# Patient Record
Sex: Female | Born: 1950 | Race: White | Hispanic: No | Marital: Married | Smoking: Former smoker
Health system: Southern US, Community
[De-identification: ages and names within clinical notes are randomized; demographics above are authoritative.]

## PROBLEM LIST (undated history)

## (undated) DIAGNOSIS — F32A Depression, unspecified: Secondary | ICD-10-CM

## (undated) DIAGNOSIS — G4730 Sleep apnea, unspecified: Secondary | ICD-10-CM

## (undated) DIAGNOSIS — F329 Major depressive disorder, single episode, unspecified: Secondary | ICD-10-CM

## (undated) HISTORY — DX: Depression, unspecified: F32.A

## (undated) HISTORY — PX: ABDOMINAL HYSTERECTOMY: SHX81

## (undated) HISTORY — DX: Sleep apnea, unspecified: G47.30

## (undated) HISTORY — DX: Major depressive disorder, single episode, unspecified: F32.9

---

## 1998-07-18 ENCOUNTER — Other Ambulatory Visit: Admission: RE | Admit: 1998-07-18 | Discharge: 1998-07-18 | Payer: Self-pay | Admitting: Obstetrics and Gynecology

## 1999-11-16 ENCOUNTER — Other Ambulatory Visit: Admission: RE | Admit: 1999-11-16 | Discharge: 1999-11-16 | Payer: Self-pay | Admitting: Obstetrics and Gynecology

## 2004-06-09 ENCOUNTER — Encounter: Admission: RE | Admit: 2004-06-09 | Discharge: 2004-09-07 | Payer: Self-pay | Admitting: Family Medicine

## 2004-10-29 ENCOUNTER — Encounter: Admission: RE | Admit: 2004-10-29 | Discharge: 2005-01-27 | Payer: Self-pay | Admitting: Family Medicine

## 2004-11-16 ENCOUNTER — Ambulatory Visit (HOSPITAL_BASED_OUTPATIENT_CLINIC_OR_DEPARTMENT_OTHER): Admission: RE | Admit: 2004-11-16 | Discharge: 2004-11-16 | Payer: Self-pay | Admitting: Family Medicine

## 2004-11-16 ENCOUNTER — Ambulatory Visit: Payer: Self-pay | Admitting: Internal Medicine

## 2005-05-24 ENCOUNTER — Other Ambulatory Visit: Admission: RE | Admit: 2005-05-24 | Discharge: 2005-05-24 | Payer: Self-pay | Admitting: Family Medicine

## 2006-08-04 ENCOUNTER — Other Ambulatory Visit: Admission: RE | Admit: 2006-08-04 | Discharge: 2006-08-04 | Payer: Self-pay | Admitting: Family Medicine

## 2007-07-07 ENCOUNTER — Encounter: Admission: RE | Admit: 2007-07-07 | Discharge: 2007-07-07 | Payer: Self-pay | Admitting: Family Medicine

## 2008-10-22 IMAGING — US US ABDOMEN COMPLETE
1 series · 14 of 25 positions shown · non-contrast
Comparison: none

CLINICAL DATA: Elevated LFTs. 
 COMPLETE ABDOMINAL ULTRASOUND:
TECHNIQUE: Complete abdominal ultrasound examination was performed including evaluation of the liver, gallbladder, bile ducts, pancreas, kidneys, spleen, IVC, and abdominal aorta.

[Series 1: us abdomen complete · 0.28mm/px · 14 of 63 slices shown]
[im 1/63]
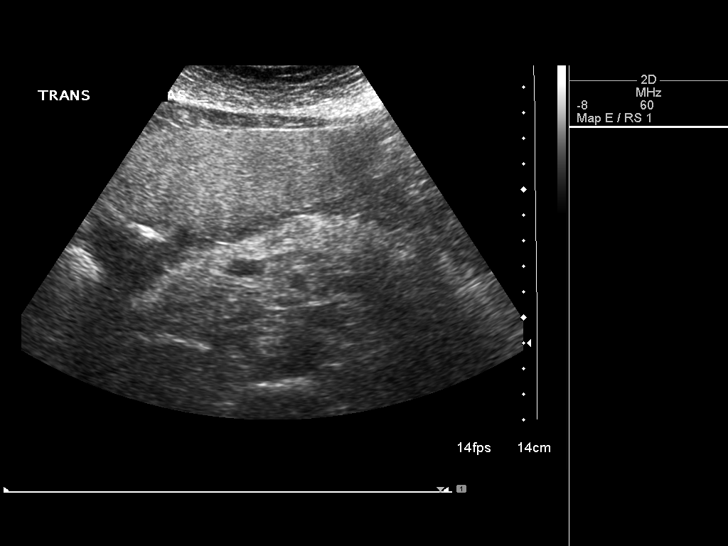
[im 6/63]
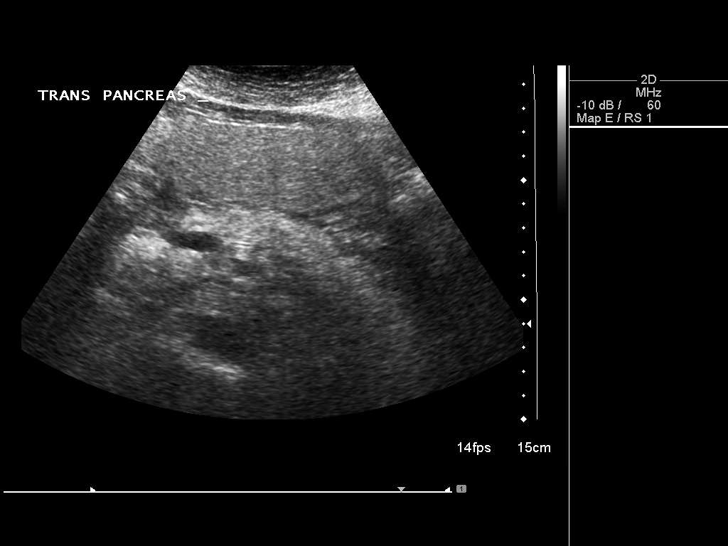
[im 11/63]
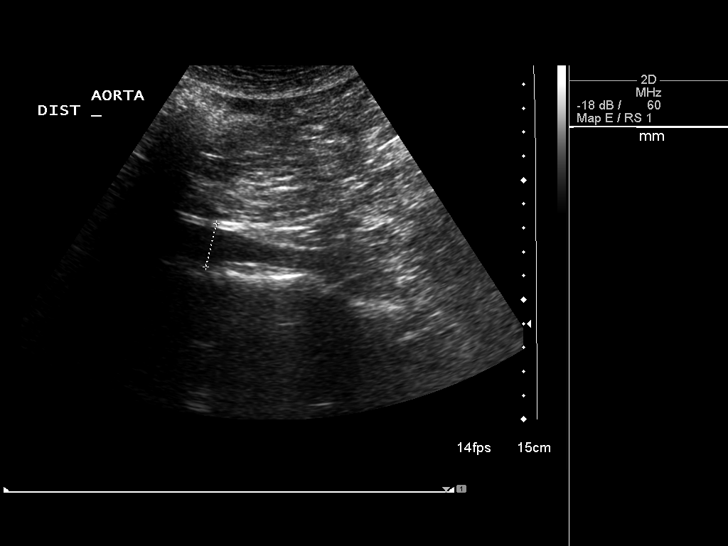
[im 16/63]
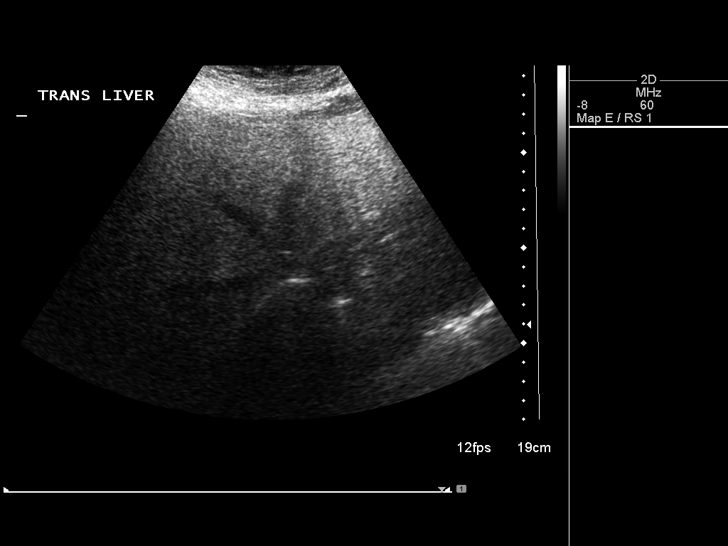
[im 21/63]
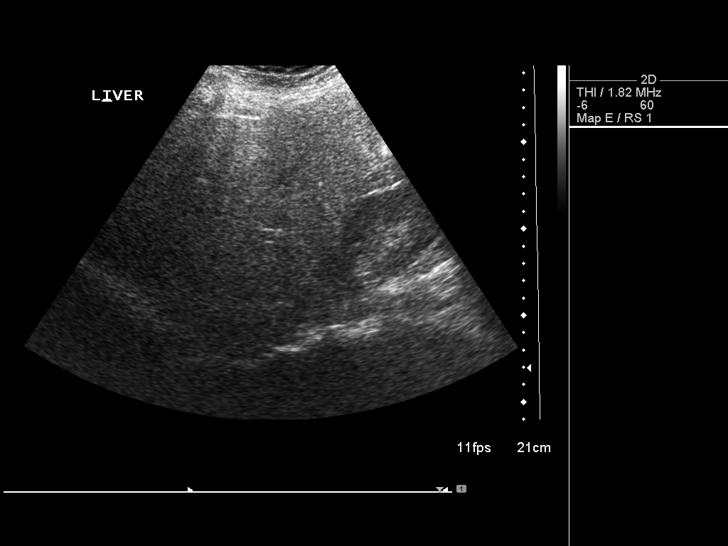
[im 24/63]
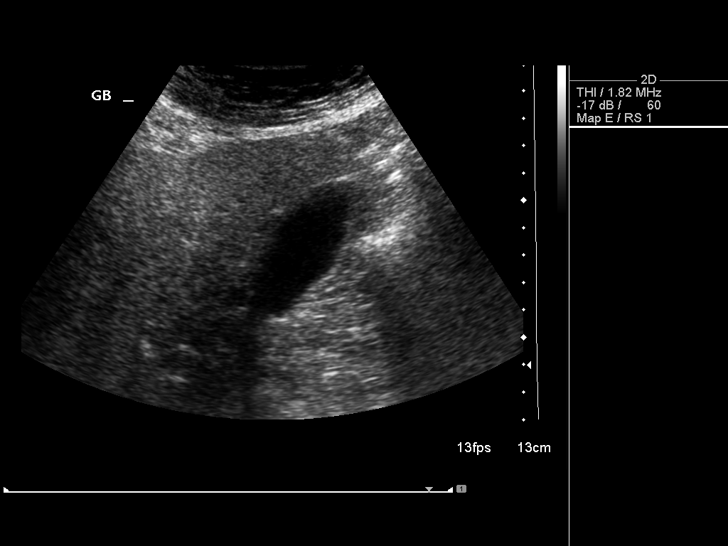
[im 29/63]
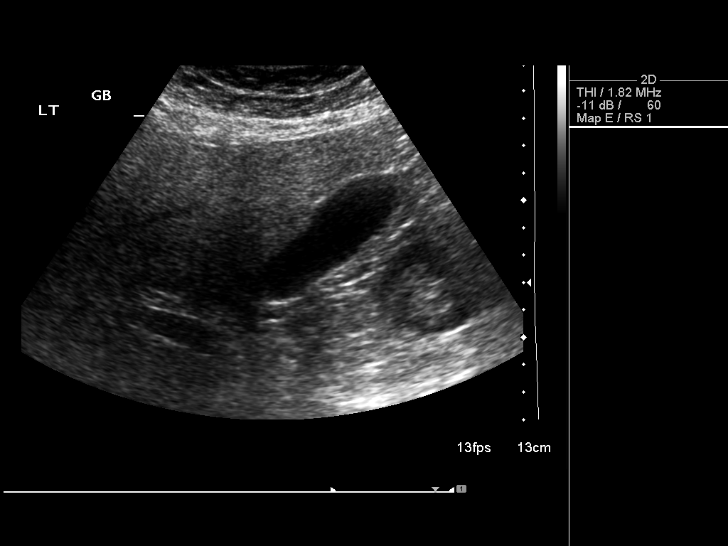
[im 34/63]
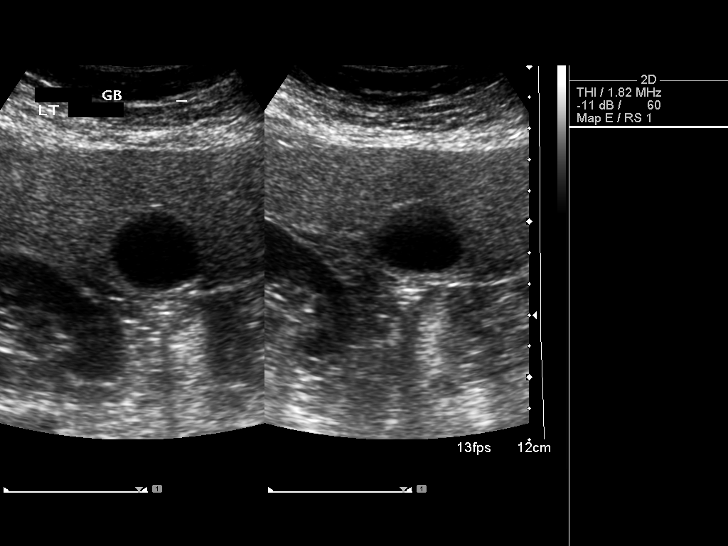
[im 39/63]
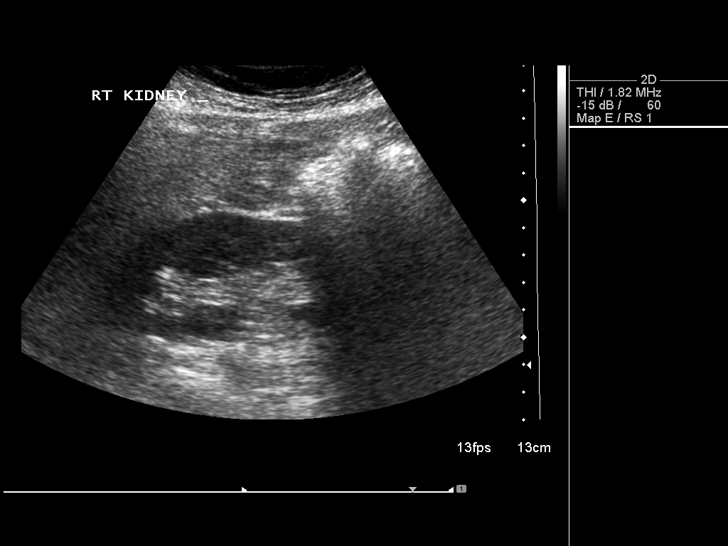
[im 42/63]
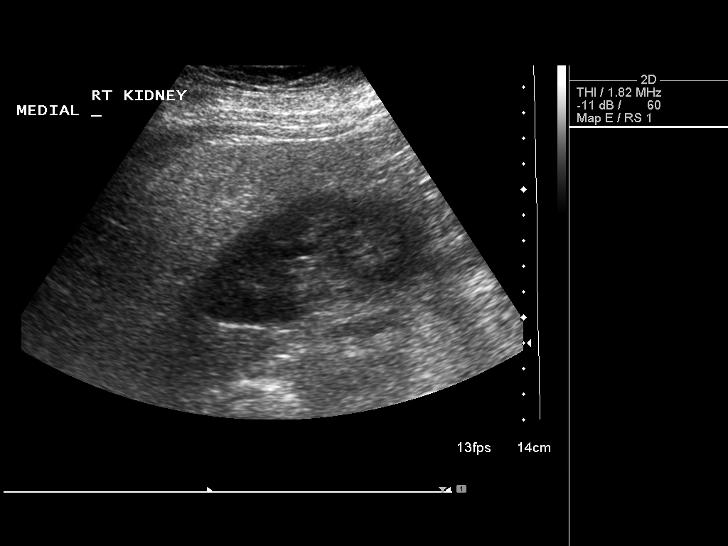
[im 47/63]
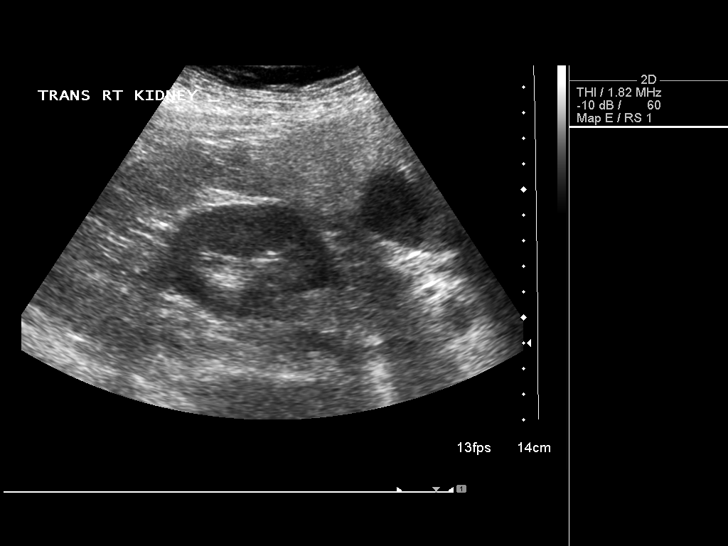
[im 52/63]
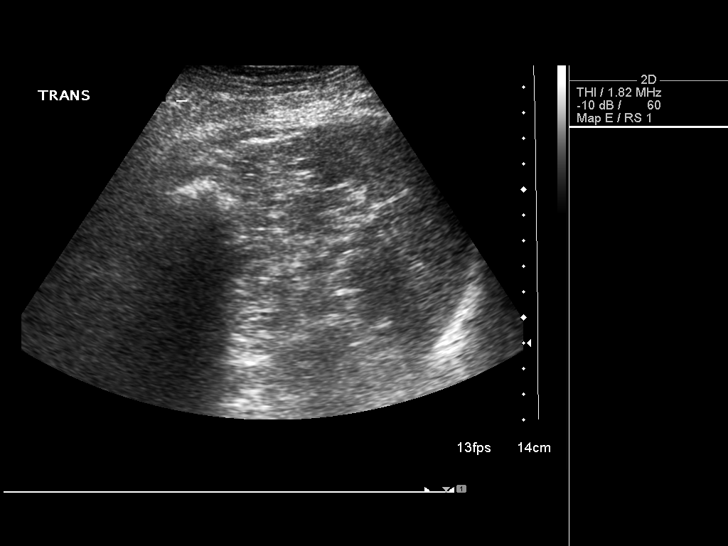
[im 57/63]
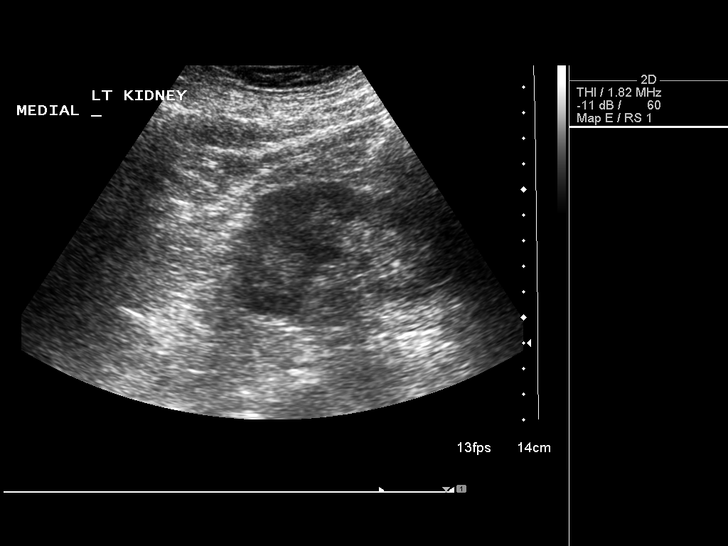
[im 63/63]
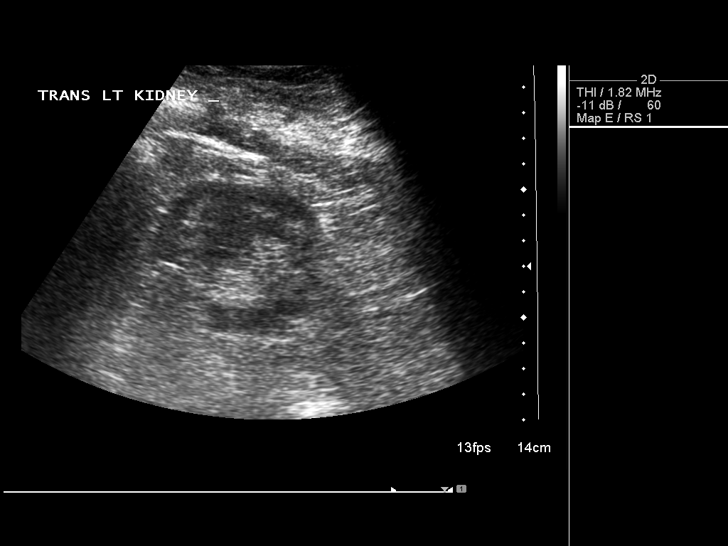

[14 of 25 positions shown; findings below may reference images not displayed]

FINDINGS: The gallbladder is well seen and no gallstones are noted.  The liver is diffusely echogenic consistent with fatty infiltration.  The common bile duct is normal measuring 4.2mm.  The IVC, pancreas, and spleen appear normal.  No hydronephrosis is seen.  The right kidney measures 10.9cm sagittally with the left kidney measuring 11.4cm.  The abdominal aorta is normal in caliber.
IMPRESSION: 1.  No gallstones. 
 2.  Diffuse fatty infiltration of the liver.

## 2011-02-19 NOTE — Procedures (Signed)
Kathryn Clayton, Kathryn Clayton                  ACCOUNT NO.:  [id number]   MEDICAL RECORD NO.:  [id number]          PATIENT TYPE:  OUT   LOCATION:  SLEEP CENTER                 FACILITY:  [facility]   PHYSICIAN:  Clinton D. Maple Hudson, M.D. DATE OF BIRTH:  [birth year]   DATE OF STUDY:  11/16/2004                              NOCTURNAL POLYSOMNOGRAM   REFERRING PHYSICIAN:  Maurice Small, MD   INDICATIONS FOR STUDY:  Hypersomnia with sleep apnea. Epworth sleepiness  score 11/24, BMI 33.9, weight 199 pounds.   SLEEP ARCHITECTURE:  Total sleep time 385 minutes with sleep efficiency of  77%. Stage I was 6%, stage II was 70%, stages III and IV were 1%, REM was  22% of total sleep time. Latency to sleep onset 51 minutes. Latency to REM  296 minutes. Awake after sleep onset 64 minutes. Arousal index 10.  She did  not take her usual Ambien for this study.   RESPIRATORY DATA:  Split study protocol. Respiratory disturbance index (RDI)  68.3 obstructive events per hour indicating severe obstructive sleep  apnea/hypopnea syndrome before CPAP. This included 51 obstructive apneas, 2  central apneas, and 115 hypopneas before CPAP. The events were not  positional. REM RDI of 4.9 per hour. CPAP was titrated to 11 CWP, RDI 4.1  per hour using a small full face Ultra Mirage mask with heated humidifier.   OXYGEN DATA:  Moderate snoring with oxygen desaturation to a nadir of 81%  before CPAP. After CPAP control, oxygen saturation held 95% to 98% on room  air.   CARDIAC DATA:  Normal sinus rhythm.   MOVEMENT/PARASOMNIA:  A total of limb jerks were recorded of which 12 were  associated with arousal or awakenings for a periodic limb movement with  arousal index of 1.9 per hour, which is probably insignificant.   IMPRESSION/RECOMMENDATIONS:  1.  Severe obstructive sleep apnea/hypopnea syndrome, RDI of 68.3 per hour      with oxygen desaturation to 81%.  2.  Successful CPAP titration to 11 CWP, RDI of 4.1 per hour using a  small      Ultra Mirage full face mask with heated humidifier.     CDY/MEDQ  D:  11/22/2004 13:45:58  T:  11/23/2004 12:00:19  Job:  [id number]

## 2013-08-22 ENCOUNTER — Other Ambulatory Visit: Payer: Self-pay | Admitting: *Deleted

## 2013-08-22 ENCOUNTER — Ambulatory Visit (INDEPENDENT_AMBULATORY_CARE_PROVIDER_SITE_OTHER): Payer: BC Managed Care – PPO | Admitting: Endocrinology

## 2013-08-22 ENCOUNTER — Encounter: Payer: Self-pay | Admitting: Endocrinology

## 2013-08-22 VITALS — BP 124/78 | HR 68 | Temp 98.3°F | Resp 12 | Ht 64.4 in | Wt 199.6 lb

## 2013-08-22 DIAGNOSIS — F32A Depression, unspecified: Secondary | ICD-10-CM | POA: Insufficient documentation

## 2013-08-22 DIAGNOSIS — E119 Type 2 diabetes mellitus without complications: Secondary | ICD-10-CM

## 2013-08-22 DIAGNOSIS — F329 Major depressive disorder, single episode, unspecified: Secondary | ICD-10-CM | POA: Insufficient documentation

## 2013-08-22 DIAGNOSIS — E785 Hyperlipidemia, unspecified: Secondary | ICD-10-CM | POA: Insufficient documentation

## 2013-08-22 LAB — COMPREHENSIVE METABOLIC PANEL
Albumin: 4.1 g/dL (ref 3.5–5.2)
BUN: 16 mg/dL (ref 6–23)
Creatinine, Ser: 0.9 mg/dL (ref 0.4–1.2)
GFR: 68.63 mL/min (ref >60.00)
Potassium: 4 mEq/L (ref 3.5–5.1)
Total Bilirubin: 1 mg/dL (ref 0.3–1.2)
Total Protein: 6.9 g/dL (ref 6.0–8.3)

## 2013-08-22 LAB — LIPID PANEL
Cholesterol: 333 mg/dL — ABNORMAL HIGH (ref 0–200)
HDL: 83.8 mg/dL (ref >39.00)
Total CHOL/HDL Ratio: 4
VLDL: 33 mg/dL (ref 0.0–40.0)

## 2013-08-22 LAB — LDL CHOLESTEROL, DIRECT: Direct LDL: 239.9 mg/dL

## 2013-08-22 MED ORDER — ROSUVASTATIN CALCIUM 20 MG PO TABS: 20.0000 mg | ORAL_TABLET | Freq: Every day | ORAL | Status: DC

## 2013-08-22 NOTE — Progress Notes (Signed)
Patient ID: Kathryn Clayton, female   DOB: Sep 06, 1951, 62 y.o.   MRN: [id number]  Reason for Appointment: Diabetes follow-up   History of Present Illness   Diagnosis: Type 2 DIABETES MELITUS, date of diagnosis: 2005     Previous history: Initially on metformin. She has been on Actos since 2008 and subsequently Januvia was added Her blood sugars have been generally well controlled with A1c 6.1-6.5 Compliance with diet and exercise has been variable and she has had difficulty losing weight until recently  Recent history: She is back for her 6 month visit for her diabetes. She is still having excellent blood sugars at home. Also has been able to lose a little weight with keeping her portions down. However has not been exercising, previously had been walking. Has been compliant with her 3 drug regimen although would prefer to have her Janumet once a day     Oral hypoglycemic drugs: Actos 30 mg, Janumet 50/100 twice a day        Side effects from medications: None Insulin regimen:         Proper timing of medications in relation to meals: Yes.          Monitors blood glucose: Once a day.    Glucometer: One Touch.          Blood Glucose readings from meter download: readings before breakfast:  104-123, at bedtime 100 Hypoglycemia frequency:  none         Meals: 3 meals per day.try to keep portions small Exercise: Only occasional walking now            Dietician visit: Most recent: 2009     Weight control: her previous weight was 202  Wt Readings from Last 3 Encounters:  08/22/13 199 lb 9.6 oz (90.538 kg)         Complications:None    Diabetes labs:  Lab Results  Component Value Date   HGBA1C 6.1 08/22/2013   No results found for this basename: GLUF,  MICROALBUR,  LDLCALC,  CREATININE   PROBLEM #2: Hyperlipidemia  She has had marked hypercholesterolemia. Because of cost she changed from the Vytorin to Lipitor on her last visit. She tolerated this well but after about 4 months she  started having a lot of muscle aches and not feeling well as well as some memory disturbance. She went off the Lipitor 3 months ago and did not call for a replacement  PROBLEM #3:  She is asking about occasional tingling in her hands when she is reading the newspaper or driving but not at night. No numbness or tingling in her feet  PROBLEM #4:  She has seen a dermatologist for a rash on her lower legs. She tends to get shallow ulcers easily and may get a little reddish. Has stopped shaving her legs because of this. No treatment was suggested     Medication List       This list is accurate as of: 08/22/13 11:59 PM.  Always use your most recent med list.               aspirin 81 MG tablet  Take 81 mg by mouth daily.     buPROPion 300 MG 24 hr tablet  Commonly known as:  WELLBUTRIN XL  Take 300 mg by mouth daily.     buPROPion 150 MG 24 hr tablet  Commonly known as:  WELLBUTRIN XL  Take 150 mg by mouth daily.     JANUMET 50-1000 MG  per tablet  Generic drug:  sitaGLIPtin-metformin  Take 1 tablet by mouth 2 (two) times daily with a meal.     pioglitazone 30 MG tablet  Commonly known as:  ACTOS  Take 30 mg by mouth daily.     rosuvastatin 20 MG tablet  Commonly known as:  CRESTOR  Take 1 tablet (20 mg total) by mouth daily.     sertraline 100 MG tablet  Commonly known as:  ZOLOFT  Take 100 mg by mouth daily.        Allergies:  Allergies  Allergen Reactions  . Sulfa Antibiotics     Past Medical History  Diagnosis Date  . Depression   . Sleep apnea     Past Surgical History  Procedure Laterality Date  . Abdominal hysterectomy      Family History  Problem Relation Age of Onset  . Heart disease Mother     Social History:  reports that she quit smoking about 40 years ago. She has never used smokeless tobacco. Her alcohol and drug histories are not on file.  Review of Systems:   no history of hypertension   Known history of depression      LABS:  Office Visit on 08/22/2013  Component Date Value Range Status  . Hemoglobin A1C 08/22/2013 6.1  4.6 - 6.5 % Final   Glycemic Control Guidelines for People with Diabetes:Non Diabetic:  <6%Goal of Therapy: <7%Additional Action Suggested:  >8%   . Sodium 08/22/2013 135  135 - 145 mEq/L Final  . Potassium 08/22/2013 4.0  3.5 - 5.1 mEq/L Final  . Chloride 08/22/2013 100  96 - 112 mEq/L Final  . CO2 08/22/2013 31  19 - 32 mEq/L Final  . Glucose, Bld 08/22/2013 109* 70 - 99 mg/dL Final  . BUN 16/07/9603 16  6 - 23 mg/dL Final  . Creatinine, Ser 08/22/2013 0.9  0.4 - 1.2 mg/dL Final  . Total Bilirubin 08/22/2013 1.0  0.3 - 1.2 mg/dL Final  . Alkaline Phosphatase 08/22/2013 54  39 - 117 U/L Final  . AST 08/22/2013 24  0 - 37 U/L Final  . ALT 08/22/2013 25  0 - 35 U/L Final  . Total Protein 08/22/2013 6.9  6.0 - 8.3 g/dL Final  . Albumin 54/06/8118 4.1  3.5 - 5.2 g/dL Final  . Calcium 14/78/2956 9.5  8.4 - 10.5 mg/dL Final  . GFR 21/30/8657 68.63  >60.00 mL/min Final  . Cholesterol 08/22/2013 333* 0 - 200 mg/dL Final   ATP III Classification       Desirable:  < 200 mg/dL               Borderline High:  200 - 239 mg/dL          High:  > = 846 mg/dL  . Triglycerides 08/22/2013 165.0* 0.0 - 149.0 mg/dL Final   Normal:  <962 mg/dLBorderline High:  150 - 199 mg/dL  . HDL 08/22/2013 83.80  >39.00 mg/dL Final  . VLDL 95/28/4132 33.0  0.0 - 40.0 mg/dL Final  . Total CHOL/HDL Ratio 08/22/2013 4   Final                  Men          Women1/2 Average Risk     3.4          3.3Average Risk          5.0          4.42X Average Risk  9.6          7.13X Average Risk          15.0          11.0                      . Direct LDL 08/22/2013 239.9   Final   Optimal:  <100 mg/dLNear or Above Optimal:  100-129 mg/dLBorderline High:  130-159 mg/dLHigh:  160-189 mg/dLVery High:  >190 mg/dL     Examination:   BP 409/81  Pulse 68  Temp(Src) 98.3 F (36.8 C)  Resp 12  Ht 5' 4.4" (1.636 m)  Wt 199  lb 9.6 oz (90.538 kg)  BMI 33.83 kg/m2  SpO2 98%  Body mass index is 33.83 kg/(m^2).    She has scattered scabs on her lower legs with mild surrounding erythema No pedal edema present  Diabetic foot exam is normal Tinel's sign appears negative and her wrists  ASSESSMENT/ PLAN::   1. Diabetes type 2  Blood glucose control appears fairly good from her home readings although checking very infrequently Will check her A1c to evaluate Reminded her to start exercising regularly for multiple benefits Meanwhile will continue same regimen for now. She can switch to Janumet XR when her current supply is finished Also need to check more readings after meals  2. Hyperlipidemia: She has had fairly significant hypercholesterolemia. She does not want to go back to Vytorin because of the cost and will try her on Crestor 20 mg with co-pay card  3. Probable mild carpal tunnel syndrome  4. She probably has tendency to staph infection on her lower legs and will have her use Hibiclens as antiseptic on a daily basis for a while   Mariana Goytia 08/23/2013, 9:33 AM

## 2013-08-22 NOTE — Patient Instructions (Signed)
Hibiclens for legs after shower  Walk daily  Start Crestor 20 mg daily  May switch to XR form of Janumet  Please check blood sugars at least half the time about 2 hours after any meal and as directed on waking up. Please bring blood sugar monitor to each visit

## 2013-08-23 ENCOUNTER — Encounter: Payer: Self-pay | Admitting: Endocrinology

## 2013-11-19 ENCOUNTER — Other Ambulatory Visit (INDEPENDENT_AMBULATORY_CARE_PROVIDER_SITE_OTHER): Payer: BC Managed Care – PPO

## 2013-11-19 DIAGNOSIS — E119 Type 2 diabetes mellitus without complications: Secondary | ICD-10-CM

## 2013-11-19 DIAGNOSIS — E785 Hyperlipidemia, unspecified: Secondary | ICD-10-CM

## 2013-11-19 LAB — LIPID PANEL
Cholesterol: 175 mg/dL (ref 0–200)
HDL: 90.1 mg/dL (ref >39.00)
LDL CALC: 69 mg/dL (ref 0–99)
TRIGLYCERIDES: 82 mg/dL (ref 0.0–149.0)
Total CHOL/HDL Ratio: 2
VLDL: 16.4 mg/dL (ref 0.0–40.0)

## 2013-11-19 LAB — HEMOGLOBIN A1C: Hgb A1c MFr Bld: 6 % (ref 4.6–6.5)

## 2013-11-19 LAB — COMPREHENSIVE METABOLIC PANEL
ALT: 24 U/L (ref 0–35)
AST: 23 U/L (ref 0–37)
Albumin: 4.3 g/dL (ref 3.5–5.2)
Alkaline Phosphatase: 59 U/L (ref 39–117)
BUN: 13 mg/dL (ref 6–23)
CALCIUM: 9.2 mg/dL (ref 8.4–10.5)
CO2: 29 meq/L (ref 19–32)
CREATININE: 0.8 mg/dL (ref 0.4–1.2)
Chloride: 102 mEq/L (ref 96–112)
GFR: 78.81 mL/min (ref >60.00)
GLUCOSE: 112 mg/dL — AB (ref 70–99)
POTASSIUM: 4.2 meq/L (ref 3.5–5.1)
SODIUM: 139 meq/L (ref 135–145)
TOTAL PROTEIN: 7.1 g/dL (ref 6.0–8.3)
Total Bilirubin: 0.8 mg/dL (ref 0.3–1.2)

## 2013-11-22 ENCOUNTER — Ambulatory Visit (INDEPENDENT_AMBULATORY_CARE_PROVIDER_SITE_OTHER): Payer: BC Managed Care – PPO | Admitting: Endocrinology

## 2013-11-22 ENCOUNTER — Encounter: Payer: Self-pay | Admitting: Endocrinology

## 2013-11-22 VITALS — BP 118/68 | HR 79 | Temp 97.8°F | Resp 14 | Ht 64.5 in | Wt 198.4 lb

## 2013-11-22 DIAGNOSIS — E119 Type 2 diabetes mellitus without complications: Secondary | ICD-10-CM

## 2013-11-22 DIAGNOSIS — E785 Hyperlipidemia, unspecified: Secondary | ICD-10-CM

## 2013-11-22 LAB — URINALYSIS, ROUTINE W REFLEX MICROSCOPIC
BILIRUBIN URINE: NEGATIVE
Nitrite: NEGATIVE
PH: 6 (ref 5.0–8.0)
TOTAL PROTEIN, URINE-UPE24: NEGATIVE
Urine Glucose: NEGATIVE
Urobilinogen, UA: 0.2 (ref 0.0–1.0)

## 2013-11-22 LAB — MICROALBUMIN / CREATININE URINE RATIO
CREATININE, U: 179.3 mg/dL
Microalb Creat Ratio: 1.1 mg/g (ref 0.0–30.0)
Microalb, Ur: 2 mg/dL — ABNORMAL HIGH (ref 0.0–1.9)

## 2013-11-22 MED ORDER — SITAGLIP PHOS-METFORMIN HCL ER 50-1000 MG PO TB24: 100.0000 mg | ORAL_TABLET | Freq: Every day | ORAL | Status: AC

## 2013-11-22 NOTE — Progress Notes (Signed)
Patient ID: Kathryn Clayton, female   DOB: [birth year], 63 y.o.   MRN: [id number]  Reason for Appointment: Diabetes follow-up   History of Present Illness   Diagnosis: Type 2 DIABETES MELITUS, date of diagnosis: 2005     Previous history: Initially on metformin. She has been on Actos since 2008 and subsequently Januvia was added Her blood sugars have been generally well controlled with A1c 6.1-6.5 Compliance with diet and exercise has been variable and she has had difficulty losing weight until recently  Recent history: She is here for her 3 month visit for type 2 diabetes.  She is still having fairly good blood sugars at home but is checking blood sugars sporadically and only regularly in the last 4 days. Also has been able to maintain her weight with keeping her portions down. However has not been exercising, previously had been walking. Has been compliant with her 3 drug regimen including Janumet     Oral hypoglycemic drugs: Actos 30 mg, Janumet 50/100 twice a day        Side effects from medications: None       Monitors blood glucose: Once a day.    Glucometer: One Touch.          Blood Glucose readings from meter download:  PREMEAL Breakfast Lunch Dinner Bedtime Overall  Glucose range:  92, 127     89    Mean/median:      127    POST-MEAL PC Breakfast PC Lunch PC Dinner  Glucose range:  126   154, 181   127   Mean/median:      Hypoglycemia frequency:  none         Meals: 3 meals per day. Tries to keep portions small Exercise: packing           Dietician visit: Most recent: 2009     Wt Readings from Last 3 Encounters:  11/22/13 198 lb 6.4 oz (89.994 kg)  08/22/13 199 lb 9.6 oz (90.538 kg)         Complications:None   Annual eye exams Diabetes labs:  Lab Results  Component Value Date   HGBA1C 6.0 11/19/2013   HGBA1C 6.1 08/22/2013   Lab Results  Component Value Date   LDLCALC 69 11/19/2013   CREATININE 0.8 11/19/2013    PROBLEM 2: Hyperlipidemia  She has had marked  hypercholesterolemia. Because of cost she changed from the Vytorin to Crestor on her last visit. Had been intolerant to Lipitor. She is taking 20 mg Crestor daily with no side effects and has had excellent reduction in her LDL level No history of CAD      Medication List       This list is accurate as of: 11/22/13  1:24 PM.  Always use your most recent med list.               aspirin 81 MG tablet  Take 81 mg by mouth daily.     buPROPion 300 MG 24 hr tablet  Commonly known as:  WELLBUTRIN XL  Take 300 mg by mouth daily.     buPROPion 150 MG 24 hr tablet  Commonly known as:  WELLBUTRIN XL  Take 150 mg by mouth daily.     JANUMET 50-1000 MG per tablet  Generic drug:  sitaGLIPtin-metformin  Take 1 tablet by mouth 2 (two) times daily with a meal.     pioglitazone 30 MG tablet  Commonly known as:  ACTOS  Take 30 mg by mouth  daily.     rosuvastatin 20 MG tablet  Commonly known as:  CRESTOR  Take 1 tablet (20 mg total) by mouth daily.     sertraline 100 MG tablet  Commonly known as:  ZOLOFT  Take 100 mg by mouth daily.        Allergies:  Allergies  Allergen Reactions  . Sulfa Antibiotics     Past Medical History  Diagnosis Date  . Depression   . Sleep apnea     Past Surgical History  Procedure Laterality Date  . Abdominal hysterectomy      Family History  Problem Relation Age of Onset  . Heart disease Mother     Social History:  reports that she quit smoking about 40 years ago. She has never used smokeless tobacco. Her alcohol and drug histories are not on file.  Review of Systems:   no history of hypertension   Known history of depression     LABS:  Appointment on 11/19/2013  Component Date Value Ref Range Status  . Hemoglobin A1C 11/19/2013 6.0  4.6 - 6.5 % Final   Glycemic Control Guidelines for People with Diabetes:Non Diabetic:  <6%Goal of Therapy: <7%Additional Action Suggested:  >8%   . Cholesterol 11/19/2013 175  0 - 200 mg/dL Final    ATP III Classification       Desirable:  < 200 mg/dL               Borderline High:  200 - 239 mg/dL          High:  > = 161240 mg/dL  . Triglycerides 11/19/2013 82.0  0.0 - 149.0 mg/dL Final   Normal:  <096<150 mg/dLBorderline High:  150 - 199 mg/dL  . HDL 11/19/2013 90.10  >39.00 mg/dL Final  . VLDL 04/54/098102/16/2015 16.4  0.0 - 40.0 mg/dL Final  . LDL Cholesterol 11/19/2013 69  0 - 99 mg/dL Final  . Total CHOL/HDL Ratio 11/19/2013 2   Final                  Men          Women1/2 Average Risk     3.4          3.3Average Risk          5.0          4.42X Average Risk          9.6          7.13X Average Risk          15.0          11.0                      . Sodium 11/19/2013 139  135 - 145 mEq/L Final  . Potassium 11/19/2013 4.2  3.5 - 5.1 mEq/L Final  . Chloride 11/19/2013 102  96 - 112 mEq/L Final  . CO2 11/19/2013 29  19 - 32 mEq/L Final  . Glucose, Bld 11/19/2013 112* 70 - 99 mg/dL Final  . BUN 19/14/782902/16/2015 13  6 - 23 mg/dL Final  . Creatinine, Ser 11/19/2013 0.8  0.4 - 1.2 mg/dL Final  . Total Bilirubin 11/19/2013 0.8  0.3 - 1.2 mg/dL Final  . Alkaline Phosphatase 11/19/2013 59  39 - 117 U/L Final  . AST 11/19/2013 23  0 - 37 U/L Final  . ALT 11/19/2013 24  0 - 35 U/L Final  . Total Protein 11/19/2013 7.1  6.0 -  8.3 g/dL Final  . Albumin 21/30/8657 4.3  3.5 - 5.2 g/dL Final  . Calcium 84/69/6295 9.2  8.4 - 10.5 mg/dL Final  . GFR 28/41/3244 78.81  >60.00 mL/min Final     Examination:   BP 118/68  Pulse 79  Temp(Src) 97.8 F (36.6 C)  Resp 14  Ht 5' 4.5" (1.638 m)  Wt 198 lb 6.4 oz (89.994 kg)  BMI 33.54 kg/m2  SpO2 98%  Body mass index is 33.54 kg/(m^2).    No ankle edema present  ASSESSMENT/ PLAN::   1. Diabetes type 2   Blood glucose control appears fairly good from her home readings although checking very infrequently A1c is again normal range and she is doing well with a 3 drug regimen Reminded her to start exercising regularly for multiple benefits She can switch to Janumet  XR for once a day convenience  2. Hyperlipidemia: She has had fairly significant hypercholesterolemia. She has an LDL below 70 on 20 mg Crestor. She can probably get adequate benefit for cardiovascular production with 10 mg daily  3. No history of hypertension  Chong Wojdyla 11/22/2013, 1:24 PM

## 2013-11-22 NOTE — Patient Instructions (Signed)
Exercise bike every 2 days,   Please check blood sugars at least half the time about 2 hours after any meal and as directed on waking up. Please bring blood sugar monitor to each visit

## 2014-02-28 ENCOUNTER — Other Ambulatory Visit: Payer: Self-pay | Admitting: Endocrinology

## 2014-03-29 ENCOUNTER — Other Ambulatory Visit: Payer: Self-pay | Admitting: Endocrinology

## 2014-04-22 ENCOUNTER — Other Ambulatory Visit: Payer: Self-pay | Admitting: *Deleted

## 2014-04-22 MED ORDER — PIOGLITAZONE HCL 30 MG PO TABS: 30.0000 mg | ORAL_TABLET | Freq: Every day | ORAL | Status: DC

## 2014-05-20 ENCOUNTER — Other Ambulatory Visit: Payer: BC Managed Care – PPO

## 2014-05-23 ENCOUNTER — Ambulatory Visit: Payer: BC Managed Care – PPO | Admitting: Endocrinology

## 2014-05-23 ENCOUNTER — Encounter: Payer: Self-pay | Admitting: *Deleted

## 2014-05-23 DIAGNOSIS — Z0289 Encounter for other administrative examinations: Secondary | ICD-10-CM

## 2014-07-14 ENCOUNTER — Other Ambulatory Visit: Payer: Self-pay | Admitting: Endocrinology

## 2014-08-14 ENCOUNTER — Other Ambulatory Visit: Payer: Self-pay | Admitting: Endocrinology

## 2014-08-21 ENCOUNTER — Other Ambulatory Visit: Payer: Self-pay | Admitting: Endocrinology

## 2014-09-02 ENCOUNTER — Other Ambulatory Visit: Payer: Self-pay | Admitting: Endocrinology

## 2014-09-02 ENCOUNTER — Telehealth: Payer: Self-pay | Admitting: Endocrinology

## 2014-09-02 NOTE — Telephone Encounter (Signed)
rx sent in for 14 days worth, patient [place] had ample [place]

## 2014-09-02 NOTE — Telephone Encounter (Signed)
Pt has moved to [place] but needs a refill on actos please called into [facility] [phone]

## 2014-09-15 ENCOUNTER — Other Ambulatory Visit: Payer: Self-pay | Admitting: Endocrinology

## 2014-11-07 ENCOUNTER — Other Ambulatory Visit: Payer: Self-pay | Admitting: Endocrinology

## 2014-11-30 ENCOUNTER — Other Ambulatory Visit: Payer: Self-pay | Admitting: Endocrinology

## 2015-02-28 ENCOUNTER — Other Ambulatory Visit: Payer: Self-pay | Admitting: Endocrinology
